# Patient Record
Sex: Male | Born: 1985 | Race: White | Hispanic: No | Marital: Single | Smoking: Current every day smoker
Health system: Southern US, Community
[De-identification: ages and names within clinical notes are randomized; demographics above are authoritative.]

## PROBLEM LIST (undated history)

## (undated) DIAGNOSIS — I219 Acute myocardial infarction, unspecified: Secondary | ICD-10-CM

## (undated) DIAGNOSIS — J45909 Unspecified asthma, uncomplicated: Secondary | ICD-10-CM

## (undated) DIAGNOSIS — N200 Calculus of kidney: Secondary | ICD-10-CM

## (undated) HISTORY — PX: SPLENECTOMY: SUR1306

## (undated) HISTORY — PX: CARDIAC CATHETERIZATION: SHX172

---

## 2006-12-17 ENCOUNTER — Other Ambulatory Visit: Payer: Self-pay

## 2006-12-17 ENCOUNTER — Emergency Department: Payer: Self-pay | Admitting: Emergency Medicine

## 2007-01-28 ENCOUNTER — Emergency Department: Payer: Self-pay | Admitting: Emergency Medicine

## 2008-06-21 ENCOUNTER — Emergency Department: Payer: Self-pay | Admitting: Emergency Medicine

## 2008-06-21 ENCOUNTER — Other Ambulatory Visit: Payer: Self-pay

## 2009-04-23 ENCOUNTER — Inpatient Hospital Stay: Payer: Self-pay | Admitting: Psychiatry

## 2009-06-29 ENCOUNTER — Emergency Department: Payer: Self-pay | Admitting: Unknown Physician Specialty

## 2010-06-25 ENCOUNTER — Emergency Department: Payer: Self-pay | Admitting: Emergency Medicine

## 2010-07-30 ENCOUNTER — Emergency Department: Payer: Self-pay | Admitting: Emergency Medicine

## 2011-07-14 ENCOUNTER — Emergency Department: Payer: Self-pay | Admitting: *Deleted

## 2012-02-04 ENCOUNTER — Emergency Department: Payer: Self-pay | Admitting: Emergency Medicine

## 2012-02-04 LAB — CBC WITH DIFFERENTIAL/PLATELET
Eosinophil #: 0.4 10*3/uL (ref 0.0–0.7)
Eosinophil %: 3 %
HCT: 47 % (ref 40.0–52.0)
HGB: 16.1 g/dL (ref 13.0–18.0)
Lymphocyte #: 3.6 10*3/uL (ref 1.0–3.6)
Lymphocyte %: 27.3 %
MCV: 93 fL (ref 80–100)
Monocyte #: 0.7 10*3/uL (ref 0.0–0.7)
Monocyte %: 5.4 %
Neutrophil #: 8.3 10*3/uL — ABNORMAL HIGH (ref 1.4–6.5)
Neutrophil %: 63.6 %
Platelet: 280 10*3/uL (ref 150–440)
RBC: 5.04 10*6/uL (ref 4.40–5.90)
RDW: 13.1 % (ref 11.5–14.5)
WBC: 13.1 10*3/uL — ABNORMAL HIGH (ref 3.8–10.6)

## 2012-02-04 LAB — CK TOTAL AND CKMB (NOT AT ARMC)
CK, Total: 182 U/L (ref 35–232)
CK-MB: 1.5 ng/mL (ref 0.5–3.6)

## 2012-02-04 LAB — COMPREHENSIVE METABOLIC PANEL
Alkaline Phosphatase: 86 U/L (ref 50–136)
Anion Gap: 11 (ref 7–16)
BUN: 7 mg/dL (ref 7–18)
Bilirubin,Total: 0.4 mg/dL (ref 0.2–1.0)
Calcium, Total: 9.3 mg/dL (ref 8.5–10.1)
Co2: 27 mmol/L (ref 21–32)
Creatinine: 0.97 mg/dL (ref 0.60–1.30)
EGFR (Non-African Amer.): >60
Glucose: 82 mg/dL (ref 65–99)
Osmolality: 276 (ref 275–301)

## 2013-10-12 IMAGING — CR DG CHEST 1V PORT
1 series · 1 of 1 positions shown · non-contrast
Comparison: none

REASON FOR EXAM: chest pain like prior mi
COMMENTS:

[portable]
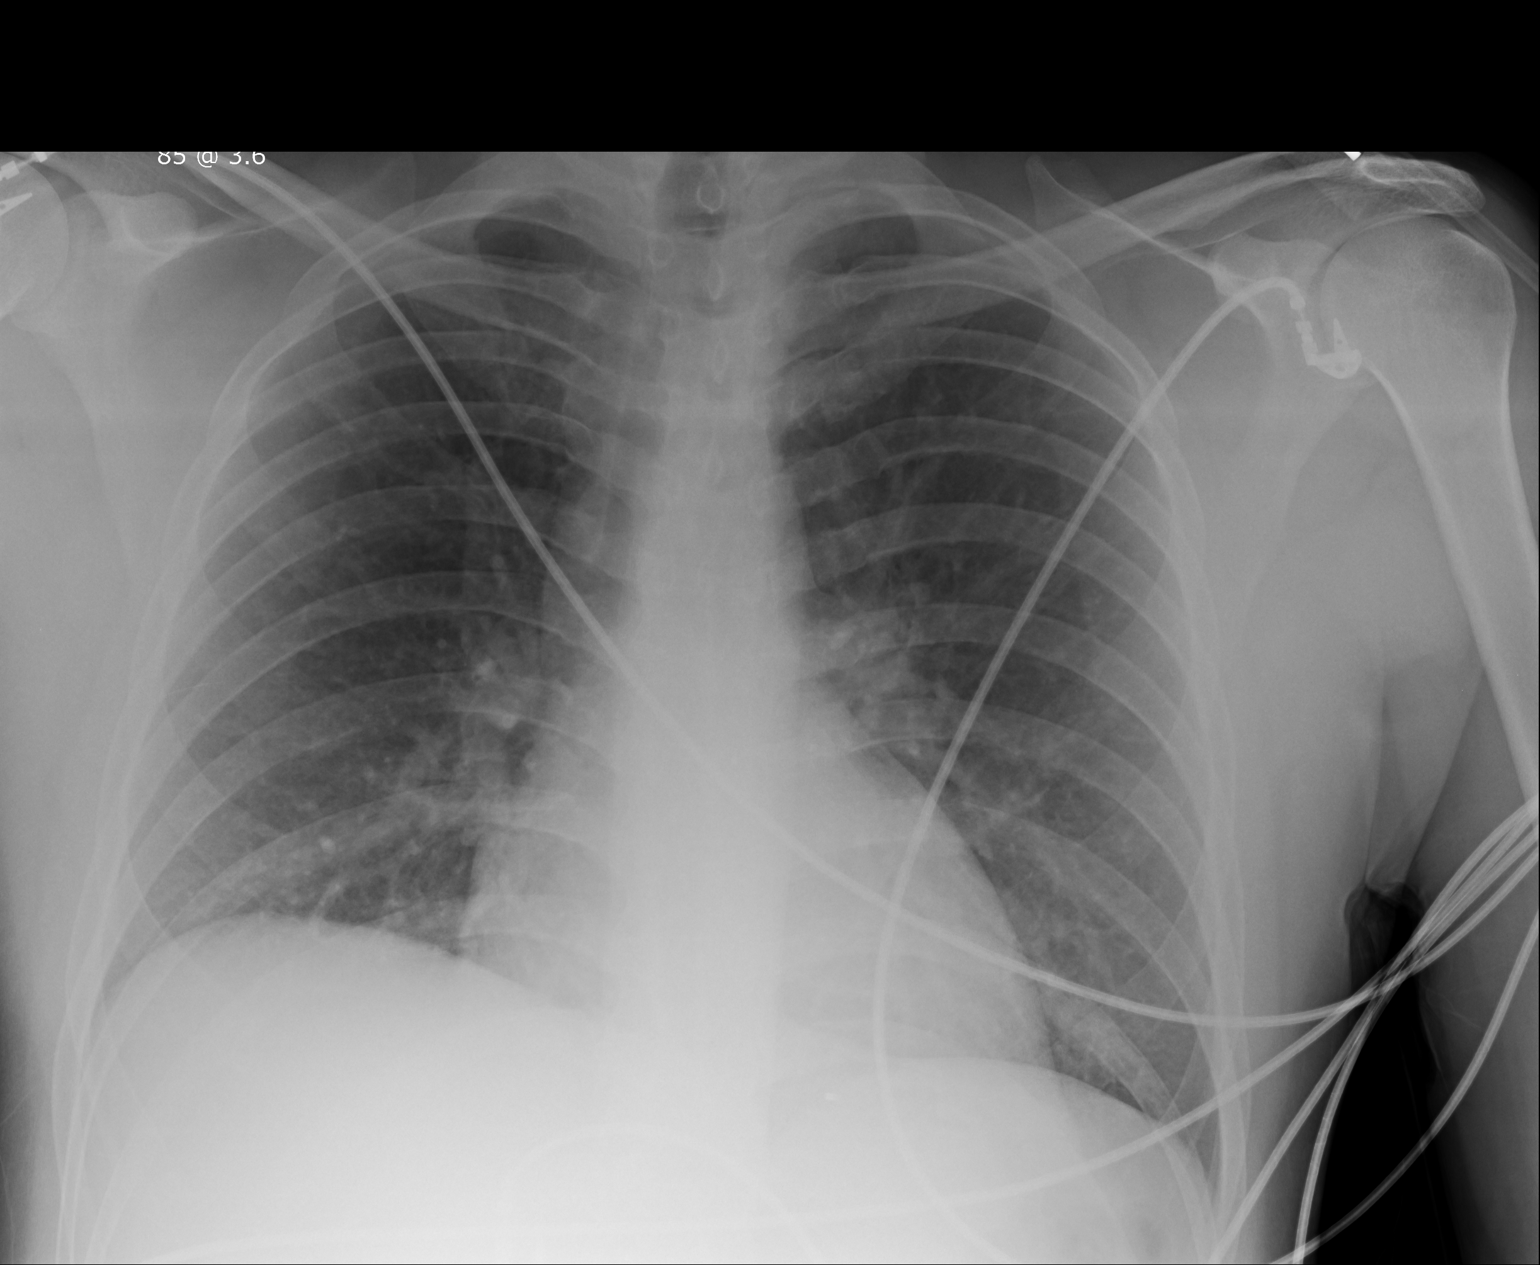

[1 of 1 positions shown; findings below may reference images not displayed]

PROCEDURE:     DXR - DXR PORTABLE CHEST SINGLE VIEW  - February 04, 2012 [DATE]

RESULT:     Comparison is made to previous study 23 March, 2009.

There is mild pulmonary vascular congestion. The cardiac silhouette is
normal. Cardiac monitoring electrodes are present area there is no effusion,
infiltrate or pneumothorax. The bony and mediastinal structures appear
unremarkable.
IMPRESSION: 1. Pulmonary vascular congestion.
2. No definite pneumonia or effusion.

## 2016-12-14 ENCOUNTER — Encounter: Payer: Self-pay | Admitting: Emergency Medicine

## 2016-12-14 ENCOUNTER — Emergency Department
Admission: EM | Admit: 2016-12-14 | Discharge: 2016-12-14 | Disposition: A | Discharge status: Home / Self Care | Payer: Self-pay | Attending: Emergency Medicine | Admitting: Emergency Medicine

## 2016-12-14 DIAGNOSIS — N200 Calculus of kidney: Secondary | ICD-10-CM | POA: Insufficient documentation

## 2016-12-14 DIAGNOSIS — F17200 Nicotine dependence, unspecified, uncomplicated: Secondary | ICD-10-CM | POA: Insufficient documentation

## 2016-12-14 DIAGNOSIS — R109 Unspecified abdominal pain: Secondary | ICD-10-CM

## 2016-12-14 DIAGNOSIS — I252 Old myocardial infarction: Secondary | ICD-10-CM | POA: Insufficient documentation

## 2016-12-14 HISTORY — DX: Calculus of kidney: N20.0

## 2016-12-14 HISTORY — DX: Acute myocardial infarction, unspecified: I21.9

## 2016-12-14 LAB — CBC
HCT: 39.8 % — ABNORMAL LOW (ref 40.0–52.0)
HEMOGLOBIN: 13.2 g/dL (ref 13.0–18.0)
MCH: 26.3 pg (ref 26.0–34.0)
MCHC: 33.1 g/dL (ref 32.0–36.0)
MCV: 79.5 fL — ABNORMAL LOW (ref 80.0–100.0)
Platelets: 473 10*3/uL — ABNORMAL HIGH (ref 150–440)
RBC: 5.01 MIL/uL (ref 4.40–5.90)
RDW: 17.4 % — ABNORMAL HIGH (ref 11.5–14.5)
WBC: 13.1 10*3/uL — ABNORMAL HIGH (ref 3.8–10.6)

## 2016-12-14 LAB — URINALYSIS, COMPLETE (UACMP) WITH MICROSCOPIC
Bacteria, UA: NONE SEEN
Bilirubin Urine: NEGATIVE
GLUCOSE, UA: NEGATIVE mg/dL
KETONES UR: NEGATIVE mg/dL
Leukocytes, UA: NEGATIVE
NITRITE: NEGATIVE
PROTEIN: NEGATIVE mg/dL
Specific Gravity, Urine: 1.013 (ref 1.005–1.030)
Squamous Epithelial / LPF: NONE SEEN
pH: 6 (ref 5.0–8.0)

## 2016-12-14 LAB — BASIC METABOLIC PANEL
ANION GAP: 7 (ref 5–15)
BUN: 10 mg/dL (ref 6–20)
CO2: 27 mmol/L (ref 22–32)
Calcium: 9.6 mg/dL (ref 8.9–10.3)
Chloride: 101 mmol/L (ref 101–111)
Creatinine, Ser: 0.72 mg/dL (ref 0.61–1.24)
GFR calc Af Amer: >60 mL/min (ref >60)
GLUCOSE: 102 mg/dL — AB (ref 65–99)
POTASSIUM: 4.1 mmol/L (ref 3.5–5.1)
Sodium: 135 mmol/L (ref 135–145)

## 2016-12-14 MED ORDER — TAMSULOSIN HCL 0.4 MG PO CAPS: 0.4000 mg | ORAL_CAPSULE | Freq: Every day | ORAL | 0 refills | Status: AC

## 2016-12-14 MED ORDER — KETOROLAC TROMETHAMINE 30 MG/ML IJ SOLN
30.0000 mg | Freq: Once | INTRAMUSCULAR | Status: AC
  Administered 2016-12-14: 30 mg via INTRAMUSCULAR
  Filled 2016-12-14: qty 1

## 2016-12-14 MED ORDER — OXYCODONE-ACETAMINOPHEN 5-325 MG PO TABS
1.0000 | ORAL_TABLET | Freq: Once | ORAL | Status: AC
  Administered 2016-12-14: 1 via ORAL
  Filled 2016-12-14: qty 1

## 2016-12-14 MED ORDER — ONDANSETRON 4 MG PO TBDP
4.0000 mg | ORAL_TABLET | Freq: Once | ORAL | Status: AC
  Administered 2016-12-14: 4 mg via ORAL
  Filled 2016-12-14: qty 1

## 2016-12-14 MED ORDER — OXYCODONE-ACETAMINOPHEN 5-325 MG PO TABS: 1.0000 | ORAL_TABLET | Freq: Four times a day (QID) | ORAL | 0 refills | Status: AC | PRN

## 2016-12-14 NOTE — ED Notes (Signed)
NAD noted at time of D/C. Pt denies questions or concerns. Pt ambulatory to the lobby at this time.  

## 2016-12-14 NOTE — ED Notes (Signed)
See triage note  States he developed RLQ pain which moves into flank area last pm  Pos n/v  Last time vomited was about 130 pm

## 2016-12-14 NOTE — ED Triage Notes (Signed)
Pt comes into the ED via POV c/o nausea and right sided flank pain that started yesterday.  Patient states he has noticed some minimal amounts of blood in his urine.  H/o kidney stones in the past.  Patient in NAD at this time with even and unlabored respirations and able to ambulate with no difficulty to the triage room. Denies any vomiting, chest pain, or shortness of breath.
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
## 2017-02-07 ENCOUNTER — Emergency Department
Admission: EM | Admit: 2017-02-07 | Discharge: 2017-02-07 | Disposition: A | Discharge status: Home / Self Care | Payer: Self-pay | Attending: Emergency Medicine | Admitting: Emergency Medicine

## 2017-02-07 DIAGNOSIS — I252 Old myocardial infarction: Secondary | ICD-10-CM | POA: Insufficient documentation

## 2017-02-07 DIAGNOSIS — F4320 Adjustment disorder, unspecified: Secondary | ICD-10-CM

## 2017-02-07 DIAGNOSIS — F17200 Nicotine dependence, unspecified, uncomplicated: Secondary | ICD-10-CM | POA: Insufficient documentation

## 2017-02-07 DIAGNOSIS — F1119 Opioid abuse with unspecified opioid-induced disorder: Secondary | ICD-10-CM | POA: Insufficient documentation

## 2017-02-07 DIAGNOSIS — F1910 Other psychoactive substance abuse, uncomplicated: Secondary | ICD-10-CM

## 2017-02-07 LAB — CBC
HEMATOCRIT: 43.3 % (ref 40.0–52.0)
Hemoglobin: 14.3 g/dL (ref 13.0–18.0)
MCH: 27.5 pg (ref 26.0–34.0)
MCHC: 33.1 g/dL (ref 32.0–36.0)
MCV: 83.1 fL (ref 80.0–100.0)
PLATELETS: 381 10*3/uL (ref 150–440)
RBC: 5.21 MIL/uL (ref 4.40–5.90)
RDW: 18.2 % — AB (ref 11.5–14.5)
WBC: 14.3 10*3/uL — AB (ref 3.8–10.6)

## 2017-02-07 LAB — COMPREHENSIVE METABOLIC PANEL
ALBUMIN: 4.6 g/dL (ref 3.5–5.0)
ALT: 78 U/L — ABNORMAL HIGH (ref 17–63)
ANION GAP: 10 (ref 5–15)
AST: 60 U/L — ABNORMAL HIGH (ref 15–41)
Alkaline Phosphatase: 81 U/L (ref 38–126)
BILIRUBIN TOTAL: 0.9 mg/dL (ref 0.3–1.2)
BUN: 9 mg/dL (ref 6–20)
CO2: 24 mmol/L (ref 22–32)
Calcium: 9.5 mg/dL (ref 8.9–10.3)
Chloride: 103 mmol/L (ref 101–111)
Creatinine, Ser: 0.96 mg/dL (ref 0.61–1.24)
GFR calc Af Amer: >60 mL/min (ref >60)
GFR calc non Af Amer: >60 mL/min (ref >60)
GLUCOSE: 108 mg/dL — AB (ref 65–99)
POTASSIUM: 3.7 mmol/L (ref 3.5–5.1)
SODIUM: 137 mmol/L (ref 135–145)
TOTAL PROTEIN: 9.2 g/dL — AB (ref 6.5–8.1)

## 2017-02-07 LAB — ACETAMINOPHEN LEVEL

## 2017-02-07 LAB — SALICYLATE LEVEL

## 2017-02-07 LAB — ETHANOL: Alcohol, Ethyl (B): <5 mg/dL (ref <5)

## 2017-02-07 MED ORDER — SODIUM CHLORIDE 0.9 % IV BOLUS (SEPSIS)
1000.0000 mL | Freq: Once | INTRAVENOUS | Status: AC
  Administered 2017-02-07: 1000 mL via INTRAVENOUS

## 2017-02-07 MED ORDER — LORAZEPAM 1 MG PO TABS
1.0000 mg | ORAL_TABLET | Freq: Once | ORAL | Status: AC
  Administered 2017-02-07: 1 mg via ORAL
  Filled 2017-02-07: qty 1
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
## 2017-02-07 NOTE — ED Notes (Addendum)
Pt's mother at bedside; pt appears much calmer now

## 2017-02-07 NOTE — Discharge Instructions (Signed)
Please follow-up with [facility] for further evaluation.

## 2017-02-07 NOTE — ED Notes (Signed)
telepsych in progress 
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
## 2017-03-22 ENCOUNTER — Emergency Department
Admission: EM | Admit: 2017-03-22 | Discharge: 2017-03-22 | Disposition: A | Discharge status: Home / Self Care | Payer: Self-pay | Attending: Emergency Medicine | Admitting: Emergency Medicine

## 2017-03-22 ENCOUNTER — Encounter: Payer: Self-pay | Admitting: Emergency Medicine

## 2017-03-22 DIAGNOSIS — F1010 Alcohol abuse, uncomplicated: Secondary | ICD-10-CM | POA: Insufficient documentation

## 2017-03-22 DIAGNOSIS — F1290 Cannabis use, unspecified, uncomplicated: Secondary | ICD-10-CM | POA: Insufficient documentation

## 2017-03-22 DIAGNOSIS — Z79899 Other long term (current) drug therapy: Secondary | ICD-10-CM | POA: Insufficient documentation

## 2017-03-22 DIAGNOSIS — J45909 Unspecified asthma, uncomplicated: Secondary | ICD-10-CM | POA: Insufficient documentation

## 2017-03-22 DIAGNOSIS — F17210 Nicotine dependence, cigarettes, uncomplicated: Secondary | ICD-10-CM | POA: Insufficient documentation

## 2017-03-22 HISTORY — DX: Unspecified asthma, uncomplicated: J45.909

## 2017-03-22 LAB — ETHANOL: Alcohol, Ethyl (B): 223 mg/dL — ABNORMAL HIGH (ref <5)

## 2017-03-22 LAB — URINE DRUG SCREEN, QUALITATIVE (ARMC ONLY)
Amphetamines, Ur Screen: NOT DETECTED
BARBITURATES, UR SCREEN: NOT DETECTED
Benzodiazepine, Ur Scrn: NOT DETECTED
COCAINE METABOLITE, UR ~~LOC~~: POSITIVE — AB
Cannabinoid 50 Ng, Ur ~~LOC~~: NOT DETECTED
MDMA (ECSTASY) UR SCREEN: NOT DETECTED
Methadone Scn, Ur: NOT DETECTED
OPIATE, UR SCREEN: NOT DETECTED
Phencyclidine (PCP) Ur S: NOT DETECTED
TRICYCLIC, UR SCREEN: NOT DETECTED

## 2017-03-22 LAB — CBC
HEMATOCRIT: 44.9 % (ref 40.0–52.0)
HEMOGLOBIN: 14.9 g/dL (ref 13.0–18.0)
MCH: 27.5 pg (ref 26.0–34.0)
MCHC: 33.2 g/dL (ref 32.0–36.0)
MCV: 82.7 fL (ref 80.0–100.0)
Platelets: 435 10*3/uL (ref 150–440)
RBC: 5.42 MIL/uL (ref 4.40–5.90)
RDW: 17.4 % — ABNORMAL HIGH (ref 11.5–14.5)
WBC: 15 10*3/uL — ABNORMAL HIGH (ref 3.8–10.6)

## 2017-03-22 LAB — COMPREHENSIVE METABOLIC PANEL
ALBUMIN: 4.4 g/dL (ref 3.5–5.0)
ALT: 40 U/L (ref 17–63)
ANION GAP: 9 (ref 5–15)
AST: 35 U/L (ref 15–41)
Alkaline Phosphatase: 76 U/L (ref 38–126)
BILIRUBIN TOTAL: 0.3 mg/dL (ref 0.3–1.2)
CHLORIDE: 111 mmol/L (ref 101–111)
CO2: 23 mmol/L (ref 22–32)
Calcium: 9.2 mg/dL (ref 8.9–10.3)
Creatinine, Ser: 0.75 mg/dL (ref 0.61–1.24)
GFR calc Af Amer: >60 mL/min (ref >60)
GFR calc non Af Amer: >60 mL/min (ref >60)
GLUCOSE: 98 mg/dL (ref 65–99)
POTASSIUM: 3.6 mmol/L (ref 3.5–5.1)
SODIUM: 143 mmol/L (ref 135–145)
TOTAL PROTEIN: 8.6 g/dL — AB (ref 6.5–8.1)

## 2017-03-22 LAB — ACETAMINOPHEN LEVEL

## 2017-03-22 LAB — SALICYLATE LEVEL: Salicylate Lvl: <7 mg/dL (ref 2.8–30.0)

## 2017-03-22 MED ORDER — OLANZAPINE 5 MG PO TABS
5.0000 mg | ORAL_TABLET | Freq: Every day | ORAL | Status: DC
  Administered 2017-03-22: 5 mg via ORAL

## 2017-03-22 MED ORDER — OLANZAPINE 5 MG PO TABS
ORAL_TABLET | ORAL | Status: AC
  Administered 2017-03-22: 5 mg via ORAL
  Filled 2017-03-22: qty 1

## 2017-03-22 NOTE — Discharge Instructions (Signed)
Please follow-up to see a psychiatrist tomorrow for reevaluation. Return to the emergency department sooner for any concerns.  It was a pleasure to take care of you today, and thank you for coming to our emergency department.  If you have any questions or concerns before leaving please ask the nurse to grab me and I'm more than happy to go through your aftercare instructions again.  If you were prescribed any opioid pain medication today such as Norco, Vicodin, Percocet, morphine, hydrocodone, or oxycodone please make sure you do not drive when you are taking this medication as it can alter your ability to drive safely.  If you have any concerns once you are home that you are not improving or are in fact getting worse before you can make it to your follow-up appointment, please do not hesitate to call 911 and come back for further evaluation.  Merrily BrittleNeil Maahir Horst MD  Results for orders placed or performed during the hospital encounter of 03/22/17  Comprehensive metabolic panel  Result Value Ref Range   Sodium 143 135 - 145 mmol/L   Potassium 3.6 3.5 - 5.1 mmol/L   Chloride 111 101 - 111 mmol/L   CO2 23 22 - 32 mmol/L   Glucose, Bld 98 65 - 99 mg/dL   BUN <5 (L) 6 - 20 mg/dL   Creatinine, Ser 0.980.75 0.61 - 1.24 mg/dL   Calcium 9.2 8.9 - 11.910.3 mg/dL   Total Protein 8.6 (H) 6.5 - 8.1 g/dL   Albumin 4.4 3.5 - 5.0 g/dL   AST 35 15 - 41 U/L   ALT 40 17 - 63 U/L   Alkaline Phosphatase 76 38 - 126 U/L   Total Bilirubin 0.3 0.3 - 1.2 mg/dL   GFR calc non Af Amer >60 >60 mL/min   GFR calc Af Amer >60 >60 mL/min   Anion gap 9 5 - 15  Ethanol  Result Value Ref Range   Alcohol, Ethyl (B) 223 (H) <5 mg/dL  cbc  Result Value Ref Range   WBC 15.0 (H) 3.8 - 10.6 K/uL   RBC 5.42 4.40 - 5.90 MIL/uL   Hemoglobin 14.9 13.0 - 18.0 g/dL   HCT 14.744.9 82.940.0 - 56.252.0 %   MCV 82.7 80.0 - 100.0 fL   MCH 27.5 26.0 - 34.0 pg   MCHC 33.2 32.0 - 36.0 g/dL   RDW 13.017.4 (H) 86.511.5 - 78.414.5 %   Platelets 435 150 - 440 K/uL    Urine Drug Screen, Qualitative  Result Value Ref Range   Tricyclic, Ur Screen NONE DETECTED NONE DETECTED   Amphetamines, Ur Screen NONE DETECTED NONE DETECTED   MDMA (Ecstasy)Ur Screen NONE DETECTED NONE DETECTED   Cocaine Metabolite,Ur [place] POSITIVE (A) NONE DETECTED   Opiate, Ur Screen NONE DETECTED NONE DETECTED   Phencyclidine (PCP) Ur S NONE DETECTED NONE DETECTED   Cannabinoid 50 Ng, Ur [place] NONE DETECTED NONE DETECTED   Barbiturates, Ur Screen NONE DETECTED NONE DETECTED   Benzodiazepine, Ur Scrn NONE DETECTED NONE DETECTED   Methadone Scn, Ur NONE DETECTED NONE DETECTED  Salicylate level  Result Value Ref Range   Salicylate Lvl <7.0 2.8 - 30.0 mg/dL  Acetaminophen level  Result Value Ref Range   Acetaminophen (Tylenol), Serum <10 (L) 10 - 30 ug/mL

## 2017-03-22 NOTE — ED Notes (Signed)
Pt yelling out profanities, agitated and requesting something to relax. MD aware. Awaiting orders.

## 2017-03-22 NOTE — ED Notes (Signed)
Pt provided phone to call for ride.  Pt also provided cup of coffee per request.

## 2017-03-22 NOTE — ED Provider Notes (Signed)
[facility] Emergency Department Provider Note   ____________________________________________   First MD Initiated Contact with Patient 03/22/17 [phone]     (approximate)  I have reviewed the triage vital signs and the nursing notes.   HISTORY  Chief Complaint Alcohol Intoxication    HPI Christopher Cain is a 31 y.o. male who was brought in to the hospital by police. The patient reports that he has a lot of stuff going on with his family and he is very agitated. He reports that his father is messing with one of his baby's mother's. He reports that there is a lot of emotional things going on. He told his mother earlier today that he didn't want to kill himself but he states it was after he told her that his father was messing around with one of his x-rays. He reports that he didn't want to break her heart. The patient denies any history of suicide attempt or suicidal ideation. He reports that he drank a 12 pack of beer but didn't do any drugs. He reports that he has a lot of agitation and stress. He denies hallucinations auditory or visual. The patient is here today for evaluation. He is not in any pain at this time.   Past Medical History:  Diagnosis Date  . Asthma   . Kidney stones   . MI (myocardial infarction) (HCC)     There are no active problems to display for this patient.   Past Surgical History:  Procedure Laterality Date  . CARDIAC CATHETERIZATION     x4  . SPLENECTOMY      Prior to Admission medications   Medication Sig Start Date End Date Taking? Authorizing Provider  gabapentin (NEURONTIN) 600 MG tablet Take 600 mg by mouth 3 (three) times daily.   Yes [provider]  oxyCODONE-acetaminophen (ROXICET) 5-325 MG tablet Take 1 tablet by mouth every 6 (six) hours as needed. 12/14/16 12/14/17  Hagler, Jami L, PA-C    Allergies Tramadol  No family history on file.  Social History Social History  Substance Use Topics  . Smoking  status: Current Every Day Smoker    Packs/day: 1.50    Types: Cigarettes  . Smokeless tobacco: Never Used  . Alcohol use Yes    Review of Systems  Constitutional: No fever/chills Eyes: No visual changes. ENT: No sore throat. Cardiovascular: Denies chest pain. Respiratory: Denies shortness of breath. Gastrointestinal: No abdominal pain.  No nausea, no vomiting.  No diarrhea.  No constipation. Genitourinary: Negative for dysuria. Musculoskeletal: Negative for back pain. Skin: Negative for rash. Neurological: Negative for headaches, focal weakness or numbness. Psychiatric:Suicidal ideation  ____________________________________________   PHYSICAL EXAM:  VITAL SIGNS: ED Triage Vitals  Enc Vitals Group     BP 03/22/17 0253 137/89     Pulse Rate 03/22/17 0253 99     Resp 03/22/17 0253 17     Temp 03/22/17 0253 97.8 F (36.6 C)     Temp Source 03/22/17 0253 Oral     SpO2 03/22/17 0253 99 %     Weight 03/22/17 0254 150 lb (68 kg)     Height 03/22/17 0254 6' (1.829 m)     Head Circumference --      Peak Flow --      Pain Score --      Pain Loc --      Pain Edu? --      Excl. in GC? --     Constitutional: Alert and oriented.  Well appearing and in Mild distress. Eyes: Conjunctivae are normal. PERRL. EOMI. Head: Atraumatic. Nose: No congestion/rhinnorhea. Mouth/Throat: Mucous membranes are moist.  Oropharynx non-erythematous. Cardiovascular: Normal rate, regular rhythm. Grossly normal heart sounds.  Good peripheral circulation. Respiratory: Normal respiratory effort.  No retractions. Lungs CTAB. Gastrointestinal: Soft and nontender. No distention. Positive bowel sounds Musculoskeletal: No lower extremity tenderness nor edema.   Neurologic:  Normal speech and language.  Skin:  Skin is warm, dry and intact.  Psychiatric: Mood and affect are normal.   ____________________________________________   LABS (all labs ordered are listed, but only abnormal results are  displayed)  Labs Reviewed  COMPREHENSIVE METABOLIC PANEL - Abnormal; Notable for the following:       Result Value   BUN <5 (*)    Total Protein 8.6 (*)    All other components within normal limits  ETHANOL - Abnormal; Notable for the following:    Alcohol, Ethyl (B) 223 (*)    All other components within normal limits  CBC - Abnormal; Notable for the following:    WBC 15.0 (*)    RDW 17.4 (*)    All other components within normal limits  URINE DRUG SCREEN, QUALITATIVE ([facility] ONLY) - Abnormal; Notable for the following:    Cocaine Metabolite,Ur [place] POSITIVE (*)    All other components within normal limits  ACETAMINOPHEN LEVEL - Abnormal; Notable for the following:    Acetaminophen (Tylenol), Serum <10 (*)    All other components within normal limits  SALICYLATE LEVEL   ____________________________________________  EKG  none ____________________________________________  RADIOLOGY  none ____________________________________________   PROCEDURES  Procedure(s) performed: None  Procedures  Critical Care performed: No  ____________________________________________   INITIAL IMPRESSION / ASSESSMENT AND PLAN / ED COURSE  Pertinent labs & imaging results that were available during my care of the patient were reviewed by me and considered in my medical decision making (see chart for details).  This is a 31 year old male who comes into the hospital today with some suicidal ideation and substance abuse. I did give the patient some Zyprexa for his anxiety and I will admit have the patient evaluated by psych.      ____________________________________________   FINAL CLINICAL IMPRESSION(S) / ED DIAGNOSES  Final diagnoses:  Suicidal ideations      NEW MEDICATIONS STARTED DURING THIS VISIT:  New Prescriptions   No medications on file     Note:  This document was prepared using Dragon voice recognition software and may include unintentional dictation errors.      Rebecka ApleyWebster, Allison P, MD 03/22/17 [id number]

## 2017-03-22 NOTE — ED Provider Notes (Signed)
The patient is now sober and appropriate. He is alert and oriented 4 and has capacity to make medical decisions. He is not suicidal and said that he drank too much yesterday. He does not want to see a psychiatrist today. He has no indication for continued involuntary commitment and I've lifted. He is stable for outpatient management.   Merrily Brittleifenbark, Tea Collums, MD 03/22/17 1438

## 2017-03-22 NOTE — ED Notes (Signed)
Pt given breakfast tray

## 2017-03-22 NOTE — ED Notes (Addendum)
MD Zenda AlpersWebster at bedside at this time for initial EDP eval. Pt increasingly agitated, although cooperative with staff.

## 2017-03-22 NOTE — ED Notes (Signed)
EDP to bedside at this time 

## 2017-03-22 NOTE — ED Notes (Signed)
Pt alert and ambulatory with steady gait, inquiring about plan of care.  [facility] not meet [facility] due to no previous psych history.  Will await psychiatry consult.

## 2017-03-22 NOTE — ED Notes (Signed)
Patient dressed out in our scrubs and clothes placed in bag and labeled  paletient had green shirt and army looking pants with drawstring.

## 2017-03-22 NOTE — ED Notes (Signed)
Pt reports feeling agitated and anxious due to being stuck in his room, delay and plan of care explained to pt at this time.  EDP notified, states he will go to bedside to assess.

## 2017-03-22 NOTE — BH Assessment (Signed)
Tele Assessment Note   Christopher Cain is an 31 y.o. male. Pt denies PMHDX. Pt reports multiple ongoing stressors including job loss, financial problems, legal difficulties (DWLR), and conflict with his father. Pt choose not to provide details regarding family conflict but, does state that his parents are supportive of him despite current issues. Pt denies SI and h/o suicide attempt. Pt denies h/o inpatient admission. Pt denies HI and access to weapons. Pt reports upcoming court date for DWLR. Pt denies drug use. Pt denies regular alcohol use but, does admit to consuming a 12 pack of beer on yesterday (5.15.18). Pt denies depressive sxs.   Past Medical History:  Past Medical History:  Diagnosis Date  . Asthma   . Kidney stones   . MI (myocardial infarction) [facility])     Past Surgical History:  Procedure Laterality Date  . CARDIAC CATHETERIZATION     x4  . SPLENECTOMY      Family History: No family history on file.  Social History:  reports that he has been smoking Cigarettes.  He has been smoking about 1.50 packs per day. He has never used smokeless tobacco. He reports that he drinks alcohol. He reports that he uses drugs, including Marijuana.  Additional Social History:  Alcohol / Drug Use Pain Medications: Pt denies abuse. Prescriptions: Pt denies abuse. Over the Counter: Pt denies abuse.  CIWA: CIWA-Ar BP: 137/89 Pulse Rate: 99 COWS:    PATIENT STRENGTHS: (choose at least two) Average or above average intelligence General fund of knowledge   Allergies:  Allergies  Allergen Reactions  . Tramadol Itching    Home Medications:  (Not in a hospital admission)  OB/GYN Status:  No LMP for male patient.  General Assessment Data Location of Assessment: [facility] ED TTS Assessment: In system Is this a Tele or Face-to-Face Assessment?: Face-to-Face Is this an Initial Assessment or a Re-assessment for this encounter?: Initial Assessment Marital status: Single Is patient  pregnant?: No Pregnancy Status: No Living Arrangements: Spouse/significant other, Parent (mother and fiance) Can pt return to current living arrangement?: Yes Admission Status: Involuntary Is patient capable of signing voluntary admission?: No Referral Source: Other Mudlogger) Insurance type: Sel;f-Pay     Crisis Care Plan Living Arrangements: Spouse/significant other, Parent (mother and fiance) Name of Psychiatrist: None Name of Therapist: None  Education Status Is patient currently in school?: No Highest grade of school patient has completed: GED  Risk to self with the past 6 months Suicidal Ideation: No Has patient been a risk to self within the past 6 months prior to admission? : No Suicidal Intent: No Has patient had any suicidal intent within the past 6 months prior to admission? : No Is patient at risk for suicide?: No Suicidal Plan?: No Has patient had any suicidal plan within the past 6 months prior to admission? : No Access to Means: No What has been your use of drugs/alcohol within the last 12 months?: Pt denies regular alcohol use. Previous Attempts/Gestures: No Other Self Harm Risks: None Reported Intentional Self Injurious Behavior: None Family Suicide History: No Recent stressful life event(s): Legal Issues, Financial Problems, Job Loss (loss job 3-4 mths ago) Persecutory voices/beliefs?: No Depression: No (pt denies depressive sxs) Depression Symptoms:  (pt denies) Substance abuse history and/or treatment for substance abuse?: No Suicide prevention information given to non-admitted patients: Not applicable  Risk to Others within the past 6 months Homicidal Ideation: No Does patient have any lifetime risk of violence toward others beyond the six months  prior to admission? : No Thoughts of Harm to Others: No Current Homicidal Intent: No Current Homicidal Plan: No Access to Homicidal Means: No History of harm to others?: No Assessment of Violence:  None Noted Does patient have access to weapons?: No Criminal Charges Pending?: Yes Describe Pending Criminal Charges: DWLR Does patient have a court date: Yes Court Date: 04/07/17 (mulitple) Is patient on probation?: No  Psychosis Hallucinations: None noted Delusions: None noted  Mental Status Report Appearance/Hygiene: In scrubs Eye Contact: Good Motor Activity: Unremarkable Speech: Logical/coherent Level of Consciousness: Alert Mood: Euthymic Affect: Other (Comment) (Mood Congruent) Anxiety Level: None Thought Processes: Coherent, Relevant Judgement: Unimpaired Orientation: Person, Place, Situation, Time Obsessive Compulsive Thoughts/Behaviors: None  Cognitive Functioning Concentration: Normal Memory: Recent Intact, Remote Intact IQ: Average Insight: Good Impulse Control: Good Appetite: Good Weight Loss: 0 Weight Gain: 0 Sleep: No Change (difficulty staying asleep) Total Hours of Sleep: 8 Vegetative Symptoms: None  ADLScreening [facility] Assessment Services) Patient's cognitive ability adequate to safely complete daily activities?: Yes Patient able to express need for assistance with ADLs?: Yes Independently performs ADLs?: Yes (appropriate for developmental age)  Prior Inpatient Therapy Prior Inpatient Therapy: No  Prior Outpatient Therapy Prior Outpatient Therapy: No Does patient have an ACCT team?: No Does patient have Intensive In-House Services?  : No Does patient have [facility] services? : No Does patient have P4CC services?: No  ADL Screening (condition at time of admission) Patient's cognitive ability adequate to safely complete daily activities?: Yes Is the patient deaf or have difficulty hearing?: No Does the patient have difficulty seeing, even when wearing glasses/contacts?: No Does the patient have difficulty concentrating, remembering, or making decisions?: No Patient able to express need for assistance with ADLs?: Yes Does the patient have  difficulty dressing or bathing?: No Independently performs ADLs?: Yes (appropriate for developmental age) Does the patient have difficulty walking or climbing stairs?: No Weakness of Legs: None Weakness of Arms/Hands: None  Home Assistive Devices/Equipment Home Assistive Devices/Equipment: None  Therapy Consults (therapy consults require a physician order) PT Evaluation Needed: No OT Evalulation Needed: No SLP Evaluation Needed: No Abuse/Neglect Assessment (Assessment to be complete while patient is alone) Physical Abuse: Denies Verbal Abuse: Denies Sexual Abuse: Denies Exploitation of patient/patient's resources: Denies Self-Neglect: Denies Values / Beliefs Cultural Requests During Hospitalization: None Spiritual Requests During Hospitalization: None Consults Spiritual Care Consult Needed: No Social Work Consult Needed: No Merchant navy officerAdvance Directives (For Healthcare) Does Patient Have a Medical Advance Directive?: No Would patient like information on creating a medical advance directive?: No - Patient declined    Additional Information 1:1 In Past 12 Months?: No CIRT Risk: No Elopement Risk: No Does patient have medical clearance?: Yes     Disposition:  Disposition Initial Assessment Completed for this Encounter: Yes Disposition of Patient: Other dispositions Other disposition(s): Other (Comment) (Psych MD Consult)  Chivonne Rascon J [place] 03/22/2017 9:48 AM

## 2017-03-22 NOTE — ED Triage Notes (Signed)
[place]. [facility] [place] [facility] d/t altercation with father, police called d/t aggression. Pt presents agitated in handcuffs, ETOH on board, cooperative.

## 2017-03-22 NOTE — BH Assessment (Signed)
Provided with socks per pt request.

## 2017-05-13 ENCOUNTER — Emergency Department
Admission: EM | Admit: 2017-05-13 | Discharge: 2017-05-13 | Discharge status: Against Medical Advice | Payer: Self-pay | Attending: Emergency Medicine | Admitting: Emergency Medicine

## 2017-05-13 ENCOUNTER — Encounter: Payer: Self-pay | Admitting: Emergency Medicine

## 2017-05-13 DIAGNOSIS — Z5320 Procedure and treatment not carried out because of patient's decision for unspecified reasons: Secondary | ICD-10-CM | POA: Insufficient documentation

## 2017-05-13 DIAGNOSIS — Z765 Malingerer [conscious simulation]: Secondary | ICD-10-CM | POA: Insufficient documentation

## 2017-05-13 DIAGNOSIS — R079 Chest pain, unspecified: Secondary | ICD-10-CM | POA: Insufficient documentation

## 2017-05-13 DIAGNOSIS — Z79899 Other long term (current) drug therapy: Secondary | ICD-10-CM | POA: Insufficient documentation

## 2017-05-13 DIAGNOSIS — F17210 Nicotine dependence, cigarettes, uncomplicated: Secondary | ICD-10-CM | POA: Insufficient documentation

## 2017-05-13 DIAGNOSIS — I252 Old myocardial infarction: Secondary | ICD-10-CM | POA: Insufficient documentation

## 2017-05-13 DIAGNOSIS — Z8659 Personal history of other mental and behavioral disorders: Secondary | ICD-10-CM | POA: Insufficient documentation

## 2017-05-13 DIAGNOSIS — Z955 Presence of coronary angioplasty implant and graft: Secondary | ICD-10-CM | POA: Insufficient documentation

## 2017-05-13 DIAGNOSIS — Z7289 Other problems related to lifestyle: Secondary | ICD-10-CM | POA: Insufficient documentation

## 2017-05-13 LAB — TROPONIN I: Troponin I: 0.03 ng/mL (ref <0.03)

## 2017-05-13 LAB — URINE DRUG SCREEN, QUALITATIVE (ARMC ONLY)
AMPHETAMINES, UR SCREEN: NOT DETECTED
BENZODIAZEPINE, UR SCRN: POSITIVE — AB
Barbiturates, Ur Screen: NOT DETECTED
Cannabinoid 50 Ng, Ur ~~LOC~~: POSITIVE — AB
Cocaine Metabolite,Ur ~~LOC~~: POSITIVE — AB
MDMA (Ecstasy)Ur Screen: NOT DETECTED
METHADONE SCREEN, URINE: NOT DETECTED
OPIATE, UR SCREEN: NOT DETECTED
Phencyclidine (PCP) Ur S: NOT DETECTED
Tricyclic, Ur Screen: POSITIVE — AB

## 2017-05-13 LAB — PROTIME-INR
INR: 1.06
Prothrombin Time: 13.8 seconds (ref 11.4–15.2)

## 2017-05-13 LAB — URINALYSIS, COMPLETE (UACMP) WITH MICROSCOPIC
Bacteria, UA: NONE SEEN
Bilirubin Urine: NEGATIVE
Glucose, UA: NEGATIVE mg/dL
Hgb urine dipstick: NEGATIVE
KETONES UR: 5 mg/dL — AB
Leukocytes, UA: NEGATIVE
Nitrite: NEGATIVE
PROTEIN: NEGATIVE mg/dL
SQUAMOUS EPITHELIAL / LPF: NONE SEEN
Specific Gravity, Urine: 1.032 — ABNORMAL HIGH (ref 1.005–1.030)
pH: 5 (ref 5.0–8.0)

## 2017-05-13 LAB — COMPREHENSIVE METABOLIC PANEL
ALBUMIN: 4.2 g/dL (ref 3.5–5.0)
ALK PHOS: 72 U/L (ref 38–126)
ALT: 56 U/L (ref 17–63)
AST: 50 U/L — AB (ref 15–41)
Anion gap: 7 (ref 5–15)
BILIRUBIN TOTAL: 0.7 mg/dL (ref 0.3–1.2)
BUN: 10 mg/dL (ref 6–20)
CALCIUM: 9.4 mg/dL (ref 8.9–10.3)
CO2: 27 mmol/L (ref 22–32)
CREATININE: 0.86 mg/dL (ref 0.61–1.24)
Chloride: 103 mmol/L (ref 101–111)
GFR calc Af Amer: >60 mL/min (ref >60)
GFR calc non Af Amer: >60 mL/min (ref >60)
Glucose, Bld: 84 mg/dL (ref 65–99)
Potassium: 4.2 mmol/L (ref 3.5–5.1)
Sodium: 137 mmol/L (ref 135–145)
TOTAL PROTEIN: 8.1 g/dL (ref 6.5–8.1)

## 2017-05-13 LAB — LIPASE, BLOOD: Lipase: 53 U/L — ABNORMAL HIGH (ref 11–51)

## 2017-05-13 LAB — ETHANOL

## 2017-05-13 MED ORDER — ASPIRIN 81 MG PO CHEW
324.0000 mg | CHEWABLE_TABLET | Freq: Once | ORAL | Status: AC
  Administered 2017-05-13: 324 mg via ORAL
  Filled 2017-05-13: qty 4

## 2017-05-13 NOTE — ED Triage Notes (Signed)
Patient was at [facility] today and left [facility] after 12 hours of waiting for a room per EMS. Patient has hx of "inflammation around the heart". 2 years noncompliant

## 2017-05-13 NOTE — ED Notes (Signed)
Troponin of 0.03 reported to Dr Alphonzo LemmingsMcShane - no new orders at this time

## 2017-05-13 NOTE — ED Notes (Signed)
Dr Alphonzo LemmingsMcShane went to talk to pt about his drug screen and pt became agitated and stated that he is leaving AMA - Dr Alphonzo LemmingsMcShane advised me to remove cath and allow pt to sign out AMA - he was advised of the dangers of leaving without further treatment and chose to leave

## 2017-05-13 NOTE — ED Provider Notes (Addendum)
[facility] Emergency Department Provider Note  ____________________________________________   I have reviewed the triage vital signs and the nursing notes.   HISTORY  Chief Complaint Chest Pain    HPI Christopher Cain is a 31 y.o. 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 Patient has had chest and multiple times since that time. He was seen 10 days ago at a [facility] facility. He was found to have borderline positive troponins and was demanding Dilaudid pain medication. Patient was given Dilaudid and then elected to leave AGAINST MEDICAL ADVICE. This was the 27th of last month. He states that it happened "on Sunday" however notes clearly indicated was Wednesday, 10 days ago not Sunday 6 days ago. Patient has not followed up with his cardiologist or anyone else since this time and he states that this morning at around 3:00 he has been suffering again from his substernal chest pain which she describes as severe. He is requesting IV pain medication for it, again. It does not radiate. He states that is the same pain he has with his heart. He states he is not markedly short of breath. The pain is constant since this morning worse over the last couple hours nothing makes it better nothing makes it worse, patient is tearful and anxious. Denies withdrawal from medications or cocaine abuse.      Past Medical History:  Diagnosis Date  . Asthma   . Kidney stones   . MI (myocardial infarction) (HCC)     There are no active problems to display for this patient.   Past Surgical History:  Procedure Laterality Date  . CARDIAC CATHETERIZATION     x4  . SPLENECTOMY      Prior to Admission medications   Medication Sig Start Date End  Date Taking? Authorizing Provider  gabapentin (NEURONTIN) 600 MG tablet Take 600 mg by mouth 3 (three) times daily.    [provider]  oxyCODONE-acetaminophen (ROXICET) 5-325 MG tablet Take 1 tablet by mouth every 6 (six) hours as needed. 12/14/16 12/14/17  Hagler, Jami L, PA-C    Allergies Tramadol  History reviewed. No pertinent family history.  Social History Social History  Substance Use Topics  . Smoking status: Current Every Day Smoker    Packs/day: 1.50    Types: Cigarettes  . Smokeless tobacco: Never Used  . Alcohol use Yes    Review of Systems Constitutional: No fever/chills Eyes: No visual changes. ENT: No sore throat. No stiff neck no neck pain Cardiovascular: Positive chest pain. Respiratory: Denies shortness of breath. Gastrointestinal:   no vomiting.  No diarrhea.  No constipation. Genitourinary: Negative for dysuria. Musculoskeletal: Negative lower extremity swelling Skin: Negative for rash. Neurological: Negative for severe headaches, focal weakness or numbness.   ____________________________________________   PHYSICAL EXAM:  VITAL SIGNS: ED Triage Vitals [05/13/17 1903]  Enc Vitals Group     BP      Pulse      Resp      Temp      Temp src      SpO2      Weight 150 lb (68 kg)     Height      Head Circumference      Peak Flow  Pain Score 10     Pain Loc      Pain Edu?      Excl. in GC?     Constitutional: Alert and oriented. Well appearing and in no acute Medical distress however initially crying and very upset. Eyes: Conjunctivae are normal Head: Atraumatic HEENT: No congestion/rhinnorhea. Mucous membranes are moist.  Oropharynx non-erythematous. Poor dentition noted Neck:   Nontender with no meningismus, no masses, no stridor Cardiovascular: Normal rate, regular rhythm. Grossly normal heart sounds.  Good peripheral circulation. Respiratory: Normal respiratory effort.  No retractions. Lungs CTAB. Abdominal: Soft and nontender.  No distention. No guarding no rebound Back:  There is no focal tenderness or step off.  there is no midline tenderness there are no lesions noted. there is no CVA tenderness Musculoskeletal: No lower extremity tenderness, no upper extremity tenderness. No joint effusions, no DVT signs strong distal pulses no edema Neurologic:  Normal speech and language. No gross focal neurologic deficits are appreciated.  Skin:  Skin is warm, dry and intact. No rash noted. Tract marks noted no evidence of obvious abscess at this time Psychiatric: Mood and affect are very anxious. Speech and behavior are normal.  ____________________________________________   LABS (all labs ordered are listed, but only abnormal results are displayed)  Labs Reviewed  PROTIME-INR  APTT  TROPONIN I  COMPREHENSIVE METABOLIC PANEL  URINALYSIS, COMPLETE ([facility]) WITH MICROSCOPIC  URINE DRUG SCREEN, QUALITATIVE ([facility] ONLY)  ETHANOL  LIPASE, BLOOD   ____________________________________________  EKG  I personally interpreted any EKGs ordered by me or triage Isolated PVC noted, sinus rhythm rate 85, normal axis, flipped T waves noted inferiorly consistent with prior EKG. No acute change from prior noted. Normal axis ____________________________________________  RADIOLOGY  I reviewed any imaging ordered by me or triage that were performed during my shift and, if possible, patient and/or family made aware of any abnormal findings. ____________________________________________   PROCEDURES  Procedure(s) performed: None  Procedures  Critical Care performed: None  ____________________________________________   INITIAL IMPRESSION / ASSESSMENT AND PLAN / ED COURSE  Pertinent labs & imaging results that were available during my care of the patient were reviewed by me and considered in my medical decision making (see chart for details).  Patient was seen and evaluated for chest pain 10 days ago, he told me was 6 days  ago, left AMA, after getting Dilaudid pain medication and is here requesting narcotic pain medication for her chest pain again. Did not follow up with anyone in the interim. Continues to smoke. Denies ongoing IV drug abuse. EKG is unchanged from prior. Has had negative CTAs of the chest in the last 8 months. Has chronic recurrent chest pain. We'll give him aspirin. Patient certainly does have a history of disease but with no EKG changes does not meet criteria for emergent percutaneous intervention. Low suspicion for PE or dissection. Patient has been intensively imaged in this regard. We will obtain cardiac enzymes as he states he's had pain since last night at 3 AM and we will continue monitoring him closely. I am reluctant to give him narcotic pain medication but I may give him some to calm him down as he is very anxious and upset. The patient  has a history of IV drug abuse has been seen here with needles in his pocket in the past, was apparently very concerned about his chest pain 10 days ago, yet he told me the wrong date for his visit there, and then neglected to follow-up in the  intervening week and a half. Patient repeatedly demanding narcotic pain medication here, at a different emergency room. All this is very concerning.  ----------------------------------------- 8:25 PM on 05/13/2017 -----------------------------------------  he was cocaine positive despite excessively time me that he was not using cocaine. Multiple other substances also found in his urine. He denies SI or HI. I did offer to continue to evaluate his chest pain with a splint and I would not give him Dilaudid. This is we discussed cocaine and I did advise in the cocaine abuse was bad for some with his heart condition, he states "I'm signing myself out of here". I did also give him smoking counseling. Total time spent in counseling drug and alcohol and tobacco abuse cessation was approximately 4 minutes. I did advise him extensively  about the risks of going home the risk of cocaine abuse the risk of leaving AMA, the fact that he can come back at any time etc. however he is insisting on going away. Soon as he realizes she was getting no Dilaudid, all of his pain symptoms seem to disappear, he was much more relaxed and simply requesting discharge.    ____________________________________________   FINAL CLINICAL IMPRESSION(S) / ED DIAGNOSES  Final diagnoses:  None      This chart was dictated using voice recognition software.  Despite best efforts to proofread,  errors can occur which can change meaning.      Jeanmarie Plant, MD 05/13/17 1943    Jeanmarie Plant, MD 05/13/17 2028

## 2017-06-07 DEATH — deceased
